# Patient Record
Sex: Male | Born: 1985 | Race: Black or African American | Hispanic: No | Marital: Single | Smoking: Never smoker
Health system: Southern US, Community
[De-identification: ages and names within clinical notes are randomized; demographics above are authoritative.]

## PROBLEM LIST (undated history)

## (undated) DIAGNOSIS — U071 COVID-19: Secondary | ICD-10-CM

## (undated) HISTORY — PX: HERNIA REPAIR: SHX51

---

## 2003-12-28 ENCOUNTER — Other Ambulatory Visit: Payer: Self-pay

## 2005-03-24 ENCOUNTER — Emergency Department: Payer: Self-pay | Admitting: Internal Medicine

## 2005-04-03 ENCOUNTER — Emergency Department: Payer: Self-pay | Admitting: Emergency Medicine

## 2007-02-11 ENCOUNTER — Emergency Department: Payer: Self-pay | Admitting: Emergency Medicine

## 2016-02-18 ENCOUNTER — Emergency Department
Admission: EM | Admit: 2016-02-18 | Discharge: 2016-02-18 | Disposition: A | Discharge status: Home / Self Care | Payer: Medicaid Other | Attending: Emergency Medicine | Admitting: Emergency Medicine

## 2016-02-18 ENCOUNTER — Encounter: Payer: Self-pay | Admitting: Emergency Medicine

## 2016-02-18 ENCOUNTER — Emergency Department: Payer: Medicaid Other

## 2016-02-18 DIAGNOSIS — S161XXA Strain of muscle, fascia and tendon at neck level, initial encounter: Secondary | ICD-10-CM | POA: Insufficient documentation

## 2016-02-18 DIAGNOSIS — Y9289 Other specified places as the place of occurrence of the external cause: Secondary | ICD-10-CM | POA: Insufficient documentation

## 2016-02-18 DIAGNOSIS — Y998 Other external cause status: Secondary | ICD-10-CM | POA: Diagnosis not present

## 2016-02-18 DIAGNOSIS — X58XXXA Exposure to other specified factors, initial encounter: Secondary | ICD-10-CM | POA: Diagnosis not present

## 2016-02-18 DIAGNOSIS — M546 Pain in thoracic spine: Secondary | ICD-10-CM

## 2016-02-18 DIAGNOSIS — S199XXA Unspecified injury of neck, initial encounter: Secondary | ICD-10-CM | POA: Diagnosis present

## 2016-02-18 DIAGNOSIS — S299XXA Unspecified injury of thorax, initial encounter: Secondary | ICD-10-CM | POA: Insufficient documentation

## 2016-02-18 DIAGNOSIS — Z880 Allergy status to penicillin: Secondary | ICD-10-CM | POA: Diagnosis not present

## 2016-02-18 DIAGNOSIS — Y9389 Activity, other specified: Secondary | ICD-10-CM | POA: Insufficient documentation

## 2016-02-18 MED ORDER — MELOXICAM 15 MG PO TABS: 15.0000 mg | ORAL_TABLET | Freq: Every day | ORAL | Status: DC

## 2016-02-18 MED ORDER — CYCLOBENZAPRINE HCL 10 MG PO TABS: 10.0000 mg | ORAL_TABLET | Freq: Three times a day (TID) | ORAL | Status: DC | PRN

## 2016-02-18 NOTE — Discharge Instructions (Signed)
Back Pain, Adult °Back pain is very common in adults. The cause of back pain is rarely dangerous and the pain often gets better over time. The cause of your back pain may not be known. Some common causes of back pain include: °· Strain of the muscles or ligaments supporting the spine. °· Wear and tear (degeneration) of the spinal disks. °· Arthritis. °· Direct injury to the back. °For many people, back pain may return. Since back pain is rarely dangerous, most people can learn to manage this condition on their own. °HOME CARE INSTRUCTIONS °Watch your back pain for any changes. The following actions may help to lessen any discomfort you are feeling: °· Remain active. It is stressful on your back to sit or stand in one place for long periods of time. Do not sit, drive, or stand in one place for more than 30 minutes at a time. Take short walks on even surfaces as soon as you are able. Try to increase the length of time you walk each day. °· Exercise regularly as directed by your health care provider. Exercise helps your back heal faster. It also helps avoid future injury by keeping your muscles strong and flexible. °· Do not stay in bed. Resting more than 1-2 days can delay your recovery. °· Pay attention to your body when you bend and lift. The most comfortable positions are those that put less stress on your recovering back. Always use proper lifting techniques, including: °· Bending your knees. °· Keeping the load close to your body. °· Avoiding twisting. °· Find a comfortable position to sleep. Use a firm mattress and lie on your side with your knees slightly bent. If you lie on your back, put a pillow under your knees. °· Avoid feeling anxious or stressed. Stress increases muscle tension and can worsen back pain. It is important to recognize when you are anxious or stressed and learn ways to manage it, such as with exercise. °· Take medicines only as directed by your health care provider. Over-the-counter  medicines to reduce pain and inflammation are often the most helpful. Your health care provider may prescribe muscle relaxant drugs. These medicines help dull your pain so you can more quickly return to your normal activities and healthy exercise. °· Apply ice to the injured area: °· Put ice in a plastic bag. °· Place a towel between your skin and the bag. °· Leave the ice on for 20 minutes, 2-3 times a day for the first 2-3 days. After that, ice and heat may be alternated to reduce pain and spasms. °· Maintain a healthy weight. Excess weight puts extra stress on your back and makes it difficult to maintain good posture. °SEEK MEDICAL CARE IF: °· You have pain that is not relieved with rest or medicine. °· You have increasing pain going down into the legs or buttocks. °· You have pain that does not improve in one week. °· You have night pain. °· You lose weight. °· You have a fever or chills. °SEEK IMMEDIATE MEDICAL CARE IF:  °· You develop new bowel or bladder control problems. °· You have unusual weakness or numbness in your arms or legs. °· You develop nausea or vomiting. °· You develop abdominal pain. °· You feel faint. °  °This information is not intended to replace advice given to you by your health care provider. Make sure you discuss any questions you have with your health care provider. °  °Document Released: 12/02/2005 Document Revised: 12/23/2014 Document Reviewed: 04/05/2014 °Elsevier Interactive Patient Education ©2016 Elsevier   Inc[employer]  Chronic Back Pain  When back pain lasts longer than 3 months, it is called chronic back pain.People with chronic back pain often go through certain periods that are more intense (flare-ups).  CAUSES Chronic back pain can be caused by wear and tear (degeneration) on different structures in your back. These structures include:  The bones of your spine (vertebrae) and the joints surrounding your spinal cord and nerve roots (facets).  The strong, fibrous tissues that  connect your vertebrae (ligaments). Degeneration of these structures may result in pressure on your nerves. This can lead to constant pain. HOME CARE INSTRUCTIONS  Avoid bending, heavy lifting, prolonged sitting, and activities which make the problem worse.  Take brief periods of rest throughout the day to reduce your pain. Lying down or standing usually is better than sitting while you are resting.  Take over-the-counter or prescription medicines only as directed by your caregiver. SEEK IMMEDIATE MEDICAL CARE IF:   You have weakness or numbness in one of your legs or feet.  You have trouble controlling your bladder or bowels.  You have nausea, vomiting, abdominal pain, shortness of breath, or fainting.   This information is not intended to replace advice given to you by your health care provider. Make sure you discuss any questions you have with your health care provider.   Document Released: 01/09/2005 Document Revised: 02/24/2012 Document Reviewed: 05/22/2015 Elsevier Interactive Patient Education 2016 Elsevier Inc.  Musculoskeletal Pain Musculoskeletal pain is muscle and boney aches and pains. These pains can occur in any part of the body. Your caregiver may treat you without knowing the cause of the pain. They may treat you if blood or urine tests, X-rays, and other tests were normal.  CAUSES There is often not a definite cause or reason for these pains. These pains may be caused by a type of germ (virus). The discomfort may also come from overuse. Overuse includes working out too hard when your body is not fit. Boney aches also come from weather changes. Bone is sensitive to atmospheric pressure changes. HOME CARE INSTRUCTIONS   Ask when your test results will be ready. Make sure you get your test results.  Only take over-the-counter or prescription medicines for pain, discomfort, or fever as directed by your caregiver. If you were given medications for your condition, do not  drive, operate machinery or power tools, or sign legal documents for 24 hours. Do not drink alcohol. Do not take sleeping pills or other medications that may interfere with treatment.  Continue all activities unless the activities cause more pain. When the pain lessens, slowly resume normal activities. Gradually increase the intensity and duration of the activities or exercise.  During periods of severe pain, bed rest may be helpful. Lay or sit in any position that is comfortable.  Putting ice on the injured area.  Put ice in a bag.  Place a towel between your skin and the bag.  Leave the ice on for 15 to 20 minutes, 3 to 4 times a day.  Follow up with your caregiver for continued problems and no reason can be found for the pain. If the pain becomes worse or does not go away, it may be necessary to repeat tests or do additional testing. Your caregiver may need to look further for a possible cause. SEEK IMMEDIATE MEDICAL CARE IF:  You have pain that is getting worse and is not relieved by medications.  You develop chest pain that is associated with shortness or breath,  sweating, feeling sick to your stomach (nauseous), or throw up (vomit).  Your pain becomes localized to the abdomen.  You develop any new symptoms that seem different or that concern you. MAKE SURE YOU:   Understand these instructions.  Will watch your condition.  Will get help right away if you are not doing well or get worse.   This information is not intended to replace advice given to you by your health care provider. Make sure you discuss any questions you have with your health care provider.   Document Released: 12/02/2005 Document Revised: 02/24/2012 Document Reviewed: 08/06/2013 Elsevier Interactive Patient Education [employer]

## 2016-02-18 NOTE — ED Notes (Signed)
Pt c/o back pain x 2 months. Pt denies injury at this time. Pt ambulatory with NAD noted at this time.

## 2016-02-18 NOTE — ED Notes (Signed)
Pt taken to Xray at this time.

## 2016-02-18 NOTE — ED Provider Notes (Signed)
[facility] Emergency Department Provider Note  ____________________________________________  Time seen: Approximately 4:51 PM  I have reviewed the triage vital signs and the nursing notes.   HISTORY  Chief Complaint Back Pain    HPI Ray Mcdonald is a 30 y.o. male who presents for evaluation of neck and upper back pain 2 months. Patient states that he tried ibuprofen and Tylenol over-the-counter with no relief. Rates his pain as a 10 over 10 at times. He is not sure if it's the way he sleeps or if it's his mattress which is about 32 years old. Denies any numbness tingling no radiation of pain. Pain is worsened with excessive lifting or moving, relieved with resting or sitting up in a chair.   History reviewed. No pertinent past medical history.  There are no active problems to display for this patient.   Past Surgical History  Procedure Laterality Date  . Hernia repair      Current Outpatient Rx  Name  Route  Sig  Dispense  Refill  . cyclobenzaprine (FLEXERIL) 10 MG tablet   Oral   Take 1 tablet (10 mg total) by mouth every 8 (eight) hours as needed for muscle spasms.   30 tablet   1   . meloxicam (MOBIC) 15 MG tablet   Oral   Take 1 tablet (15 mg total) by mouth daily.   30 tablet   0     Allergies Penicillins  History reviewed. No pertinent family history.  Social History Social History  Substance Use Topics  . Smoking status: Never Smoker   . Smokeless tobacco: Never Used  . Alcohol Use: Yes     Comment: occassionally    Review of Systems Constitutional: No fever/chills Eyes: No visual changes. ENT: No sore throat. Cardiovascular: Denies chest pain. Respiratory: Denies shortness of breath. Musculoskeletal: Positive for upper neck and back pain.. Skin: Negative for rash. Neurological: Negative for headaches, focal weakness or numbness.  10-point ROS otherwise  negative.  ____________________________________________   PHYSICAL EXAM: BP 144/85 mmHg  Pulse 72  Temp(Src) 97.4 F (36.3 C) (Oral)  Resp 18  Ht  (1.905 m)  Wt 113.399 kg  BMI 31.25 kg/m2  SpO2 100%  VITAL SIGNS: ED Triage Vitals  Enc Vitals Group     BP --      Pulse --      Resp --      Temp --      Temp src --      SpO2 --      Weight --      Height --      Head Cir --      Peak Flow --      Pain Score --      Pain Loc --      Pain Edu? --      Excl. in GC? --     Constitutional: Alert and oriented. Well appearing and in no acute distress. Neck: No stridor. Neck full range of motion nontender.   Cardiovascular: Normal rate, regular rhythm. Grossly normal heart sounds.  Good peripheral circulation. Respiratory: Normal respiratory effort.  No retractions. Lungs CTAB. Musculoskeletal: Thoracic spine point tenderness noted both vertebral in paraspinally. Upper extremities with full range of motion distally neurovascularly intact strength intact equal bilaterally. Able to bend and flex and touch his toes. Neurologic:  Normal speech and language. No gross focal neurologic deficits are appreciated. No gait instability. Skin:  Skin is warm, dry and  intact. No rash noted. Psychiatric: Mood and affect are normal. Speech and behavior are normal.  ____________________________________________   LABS (all labs ordered are listed, but only abnormal results are displayed)  Labs Reviewed - No data to display  RADIOLOGY  Cervical and thoracic x-rays negative for any acute osseous findings. ____________________________________________   PROCEDURES  Procedure(s) performed: None  Critical Care performed: No  ____________________________________________   INITIAL IMPRESSION / ASSESSMENT AND PLAN / ED COURSE  Pertinent labs & imaging results that were available during my care of the patient were reviewed by me and considered in my medical decision making (see  chart for details).  Unexplained cervical and thoracic paraspinal pain. Rx given for Mobic 15 mg daily and Flexeril 10 mg 3 times a day. Work excuse 24 hours given. Patient follow-up PCP or return to the ER with any worsening symptomology. ____________________________________________   FINAL CLINICAL IMPRESSION(S) / ED DIAGNOSES  Final diagnoses:  Cervical myofascial strain, initial encounter  Thoracic back pain, unspecified back pain laterality     This chart was dictated using voice recognition software/Dragon. Despite best efforts to proofread, errors can occur which can change the meaning. Any change was purely unintentional.   Evangeline Dakinharles M [employer], PA-C 02/18/16 1814  Phineas SemenGraydon Goodman, MD 02/20/16 [phone]

## 2016-02-18 NOTE — ED Notes (Signed)
NAD noted at time of D/C. Pt denies questions or concerns. Pt ambulatory to the lobby at this time.  

## 2017-02-17 ENCOUNTER — Encounter: Payer: Self-pay | Admitting: Emergency Medicine

## 2017-02-17 ENCOUNTER — Emergency Department: Payer: Self-pay

## 2017-02-17 DIAGNOSIS — J069 Acute upper respiratory infection, unspecified: Secondary | ICD-10-CM | POA: Insufficient documentation

## 2017-02-17 NOTE — ED Triage Notes (Signed)
Pt presents to ED with cough and nasal congestion for the past 3-4 days. Denies fever.

## 2017-02-18 ENCOUNTER — Emergency Department
Admission: EM | Admit: 2017-02-18 | Discharge: 2017-02-18 | Disposition: A | Discharge status: Home / Self Care | Payer: Self-pay | Attending: Emergency Medicine | Admitting: Emergency Medicine

## 2017-02-18 DIAGNOSIS — J069 Acute upper respiratory infection, unspecified: Secondary | ICD-10-CM

## 2017-02-18 MED ORDER — BENZONATATE 100 MG PO CAPS
100.0000 mg | ORAL_CAPSULE | Freq: Four times a day (QID) | ORAL | 0 refills | Status: DC | PRN
Start: 2017-02-18 — End: 2019-05-25

## 2017-02-18 MED ORDER — BENZONATATE 100 MG PO CAPS
100.0000 mg | ORAL_CAPSULE | Freq: Once | ORAL | Status: AC
  Administered 2017-02-18: 100 mg via ORAL
  Filled 2017-02-18: qty 1

## 2017-02-18 NOTE — ED Notes (Signed)
Patient discharged to home per MD order. Patient in stable condition, and deemed medically cleared by ED provider for discharge. Discharge instructions reviewed with patient/family using "Teach Back"; verbalized understanding of medication education and administration, and information about follow-up care. Denies further concerns. ° °

## 2017-02-18 NOTE — ED Provider Notes (Signed)
[facility] Emergency Department Provider Note   ____________________________________________   First MD Initiated Contact with Patient 02/18/17 0209     (approximate)  I have reviewed the triage vital signs and the nursing notes.   HISTORY  Chief Complaint Cough and Nasal Congestion    HPI Ray Mcdonald is a 31 y.o. male who comes into the hospital today with some congestion and a nonproductive cough. The patient has a little bit of chest pain when he coughs but nothing consistent. He has tried some over-the-counter medications but it has not helped with the symptoms. The patient denies any fever, sick contacts, shortness of breath, nausea or vomiting. The patient has been having these symptoms for the past 3-4 days. He was concerned that it was not getting any better so he decided to come into the hospital today for evaluation.   History reviewed. No pertinent past medical history.  There are no active problems to display for this patient.   Past Surgical History:  Procedure Laterality Date  . HERNIA REPAIR      Prior to Admission medications   Medication Sig Start Date End Date Taking? Authorizing Provider  benzonatate (TESSALON PERLES) 100 MG capsule Take 1 capsule (100 mg total) by mouth every 6 (six) hours as needed for cough. 02/18/17   Rebecka ApleyAllison P Shavy Beachem, MD  cyclobenzaprine (FLEXERIL) 10 MG tablet Take 1 tablet (10 mg total) by mouth every 8 (eight) hours as needed for muscle spasms. 02/18/16   Charmayne Sheerharles M Beers, PA-C  meloxicam (MOBIC) 15 MG tablet Take 1 tablet (15 mg total) by mouth daily. 02/18/16   Evangeline Dakinharles M Beers, PA-C    Allergies Penicillins  No family history on file.  Social History Social History  Substance Use Topics  . Smoking status: Never Smoker  . Smokeless tobacco: Never Used  . Alcohol use Yes     Comment: occassionally    Review of Systems Constitutional: No fever/chills Eyes: No visual changes. ENT: No sore  throat. Cardiovascular:  chest pain. Respiratory: cough Gastrointestinal: No abdominal pain.  No nausea, no vomiting.  No diarrhea.  No constipation. Genitourinary: Negative for dysuria. Musculoskeletal: Negative for back pain. Skin: Negative for rash. Neurological: Negative for headaches, focal weakness or numbness.  10-point ROS otherwise negative.  ____________________________________________   PHYSICAL EXAM:  VITAL SIGNS: ED Triage Vitals  Enc Vitals Group     BP 02/17/17 2237 (!) 166/98     Pulse Rate 02/17/17 2237 97     Resp 02/17/17 2237 20     Temp 02/17/17 2237 98.1 F (36.7 C)     Temp Source 02/17/17 2237 Oral     SpO2 02/17/17 2237 96 %     Weight 02/17/17 2237 280 lb (127 kg)     Height 02/17/17 2237 6\' 3"  (1.905 m)     Head Circumference --      Peak Flow --      Pain Score 02/17/17 2238 0     Pain Loc --      Pain Edu? --      Excl. in GC? --     Constitutional: Alert and oriented. Well appearing and in mild distress. Eyes: Conjunctivae are normal. PERRL. EOMI. Head: Atraumatic. Nose: No congestion/rhinnorhea. Mouth/Throat: Mucous membranes are moist.  Oropharynx non-erythematous. Cardiovascular: Normal rate, regular rhythm. Grossly normal heart sounds.  Good peripheral circulation. Respiratory: Normal respiratory effort.  No retractions. Lungs CTAB. Gastrointestinal: Soft and nontender. No distention. Positive bowel sounds Musculoskeletal: No  lower extremity tenderness nor edema.   Neurologic:  Normal speech and language.  Skin:  Skin is warm, dry and intact.  Psychiatric: Mood and affect are normal.   ____________________________________________   LABS (all labs ordered are listed, but only abnormal results are displayed)  Labs Reviewed - No data to  display ____________________________________________  EKG  none ____________________________________________  RADIOLOGY  CXR ____________________________________________   PROCEDURES  Procedure(s) performed: None  Procedures  Critical Care performed: No  ____________________________________________   INITIAL IMPRESSION / ASSESSMENT AND PLAN / ED COURSE  Pertinent labs & imaging results that were available during my care of the patient were reviewed by me and considered in my medical decision making (see chart for details).  This is a 31 year old male who comes into the hospital today with some congestion and cough. The patient is not having any wheezing and no shortness of breath he is not retracting or in any acute distress. The patient had a chest x-ray which did not show pneumonia. I feel that the patient has an upper respiratory infection. I will give the patient a dose of benzonatate. I informed him that viral upper respiratory infections can have symptoms from 7-10 days and the cough can linger for a few weeks. The patient be discharged home to follow-up with the acute care clinic.  Clinical Course as of Feb 18 345  Tue Feb 18, 2017  0209 No active cardiopulmonary disease. DG Chest 2 View [AW]    Clinical Course User Index [AW] Rebecka Apley, MD     ____________________________________________   FINAL CLINICAL IMPRESSION(S) / ED DIAGNOSES  Final diagnoses:  Viral upper respiratory tract infection      NEW MEDICATIONS STARTED DURING THIS VISIT:  Discharge Medication List as of 02/18/2017  2:54 AM    START taking these medications   Details  benzonatate (TESSALON PERLES) 100 MG capsule Take 1 capsule (100 mg total) by mouth every 6 (six) hours as needed for cough., Starting Tue 02/18/2017, Print         Note:  This document was prepared using Dragon voice recognition software and may include unintentional dictation errors.    Rebecka Apley,  MD 02/18/17 [phone]

## 2017-02-18 NOTE — ED Notes (Addendum)
Pt in with co cough for few days denies any fever for few days, also co nasal congestion and cold symptoms.

## 2017-03-17 ENCOUNTER — Encounter: Payer: Self-pay | Admitting: *Deleted

## 2017-03-17 ENCOUNTER — Emergency Department
Admission: EM | Admit: 2017-03-17 | Discharge: 2017-03-17 | Disposition: A | Discharge status: Home / Self Care | Payer: Medicaid Other | Attending: Emergency Medicine | Admitting: Emergency Medicine

## 2017-03-17 DIAGNOSIS — M65351 Trigger finger, right little finger: Secondary | ICD-10-CM | POA: Insufficient documentation

## 2017-03-17 MED ORDER — MELOXICAM 7.5 MG PO TABS: 7.5000 mg | ORAL_TABLET | Freq: Every day | ORAL | 1 refills | Status: AC

## 2017-03-17 NOTE — ED Provider Notes (Signed)
[facility] Emergency Department Provider Note  ____________________________________________  Time seen: Approximately 7:51 PM  I have reviewed the triage vital signs and the nursing notes.   HISTORY  Chief Complaint Hand Pain    HPI Ray Mcdonald is a 31 y.o. male presenting to the emergency department with a trigger finger of the right fifth digit. Patient states that he was taking a shower this morning when he noticed that his right fifth digit was locked into flexion. Patient has not experienced similar symptoms in the past. Patient is concerned as he works in [employer]. No alleviating measures have been attempted. Patient denies weakness and radiculopathy.   History reviewed. No pertinent past medical history.  There are no active problems to display for this patient.   Past Surgical History:  Procedure Laterality Date  . HERNIA REPAIR      Prior to Admission medications   Medication Sig Start Date End Date Taking? Authorizing Provider  benzonatate (TESSALON PERLES) 100 MG capsule Take 1 capsule (100 mg total) by mouth every 6 (six) hours as needed for cough. 02/18/17   Rebecka Apley, MD  cyclobenzaprine (FLEXERIL) 10 MG tablet Take 1 tablet (10 mg total) by mouth every 8 (eight) hours as needed for muscle spasms. 02/18/16   Charmayne Sheer Beers, PA-C  meloxicam (MOBIC) 7.5 MG tablet Take 1 tablet (7.5 mg total) by mouth daily. 03/17/17 03/24/17  Orvil Feil, PA-C    Allergies Penicillins  History reviewed. No pertinent family history.  Social History Social History  Substance Use Topics  . Smoking status: Never Smoker  . Smokeless tobacco: Never Used  . Alcohol use Yes     Comment: occassionally     Review of Systems  Constitutional: No fever/chills Eyes: No visual changes. No discharge ENT: No upper respiratory complaints. Cardiovascular: no chest pain. Musculoskeletal: Patient has right fifth digit trigger finger. Skin:  Negative for rash, abrasions, lacerations, ecchymosis. Neurological: Negative for headaches, focal weakness or numbness. ____________________________________________   PHYSICAL EXAM:  VITAL SIGNS: ED Triage Vitals [03/17/17 1616]  Enc Vitals Group     BP 131/81     Pulse Rate 87     Resp 18     Temp 98.5 F (36.9 C)     Temp Source Oral     SpO2 97 %     Weight 280 lb (127 kg)     Height  (1.905 m)     Head Circumference      Peak Flow      Pain Score      Pain Loc      Pain Edu?      Excl. in GC?     Constitutional: Alert and oriented. Well appearing and in no acute distress. Eyes: Conjunctivae are normal. PERRL. EOMI. Head: Atraumatic. Cardiovascular: Normal rate, regular rhythm. Normal S1 and S2.  Good peripheral circulation. Respiratory: Normal respiratory effort without tachypnea or retractions. Lungs CTAB. Good air entry to the bases with no decreased or absent breath sounds. Musculoskeletal: Patient has 5 out of 5 strength in the upper extremities bilaterally. Patient's right fifth digit is locked into 30 of flexion at the DIP joint. There is a palpable nodule at the A1 pulley. Palpable radial and ulnar pulses bilaterally and symmetrically. Neurologic:  Normal speech and language. No gross focal neurologic deficits are appreciated. Reflexes are 2+ and symmetric in the upper extremities bilaterally. Skin:  Skin is warm, dry and intact. No rash noted. Psychiatric: Mood  and affect are normal. Speech and behavior are normal. Patient exhibits appropriate insight and judgement. ____________________________________________   LABS (all labs ordered are listed, but only abnormal results are displayed)  Labs Reviewed - No data to display ____________________________________________  EKG   ____________________________________________  RADIOLOGY  No results found.  ____________________________________________    PROCEDURES  Procedure(s) performed:     Procedures    Medications - No data to display   ____________________________________________   INITIAL IMPRESSION / ASSESSMENT AND PLAN / ED COURSE  Pertinent labs & imaging results that were available during my care of the patient were reviewed by me and considered in my medical decision making (see chart for details).  Review of the [place] CSRS was performed in accordance of the [facility] prior to dispensing any controlled drugs.     Assessment and plan: Right Fifth Digit Trigger Finger  Patient presents to the emergency department with stenosing tenosynovitis of the right fifth digit. Patient's right fifth digit was splinted into extension in the emergency department. Patient was discharged with Mobic. A work note was given. A referral was given orthopedics, Dr. Joice Lofts. All patient questions were answered.  ____________________________________________  FINAL CLINICAL IMPRESSION(S) / ED DIAGNOSES  Final diagnoses:  Trigger little finger of right hand      NEW MEDICATIONS STARTED DURING THIS VISIT:  New Prescriptions   MELOXICAM (MOBIC) 7.5 MG TABLET    Take 1 tablet (7.5 mg total) by mouth daily.        This chart was dictated using voice recognition software/Dragon. Despite best efforts to proofread, errors can occur which can change the meaning. Any change was purely unintentional.    Orvil Feil, PA-C 03/17/17 2007    Sharyn Creamer, MD 03/17/17 [phone]

## 2017-03-17 NOTE — ED Notes (Signed)
Splint applied to pt by ED staff, pt denies numbness, tingling to fingers.

## 2017-03-17 NOTE — ED Notes (Signed)
See triage note  Developed right 5 th finger stiffness and numbness today  No injury

## 2017-03-17 NOTE — ED Triage Notes (Signed)
Pt reports sudden onset of right pinky numbness and stiffness this afternoon after taking a shower. PT denies injury. PT reports he is unable to move pinky. Pt denies pain to the finger. Small amount of swelling noted.

## 2017-03-20 ENCOUNTER — Emergency Department: Payer: Medicaid Other

## 2017-03-20 ENCOUNTER — Encounter: Payer: Self-pay | Admitting: Emergency Medicine

## 2017-03-20 ENCOUNTER — Emergency Department
Admission: EM | Admit: 2017-03-20 | Discharge: 2017-03-20 | Disposition: A | Discharge status: Home / Self Care | Payer: Medicaid Other | Attending: Emergency Medicine | Admitting: Emergency Medicine

## 2017-03-20 DIAGNOSIS — M65351 Trigger finger, right little finger: Secondary | ICD-10-CM | POA: Insufficient documentation

## 2017-03-20 NOTE — ED Notes (Signed)
See triage note  Having pain to right 5 th finger. finger is inflamed and having increased pain

## 2017-03-20 NOTE — ED Triage Notes (Signed)
Pt comes into the ED via POV c/o right 5th digit finger pain with inflammation and redness present.  Patient states he cannot bend his finger.  It was diagnosed as trigger finger but the pain has increased and there has been no improvement in the finger.  Patient would like an x-ray of the finger to make sure it is not broken.

## 2017-03-20 NOTE — ED Provider Notes (Signed)
[facility] Emergency Department Provider Note  ____________________________________________  Time seen: Approximately 3:06 PM  I have reviewed the triage vital signs and the nursing notes.   HISTORY  Chief Complaint Hand Pain    HPI Ray Mcdonald is a 31 y.o. male who presents emergency department for complaint of continued sugar finger. Patient was evaluated in this department 3 days prior and was diagnosed sugar finger. Patient reports to the emergency department because "it's not better." Patient is also concerned that "it might be broken." Patient denies any direct trauma to the hand or finger. Patient reports pain to the distal phalanx. No other pain. Patient has been taking the meloxicam as prescribed. He has been wearing the splint but is not wearing same today. No other injury or complaint at this time.   History reviewed. No pertinent past medical history.  There are no active problems to display for this patient.   Past Surgical History:  Procedure Laterality Date  . HERNIA REPAIR      Prior to Admission medications   Medication Sig Start Date End Date Taking? Authorizing Provider  benzonatate (TESSALON PERLES) 100 MG capsule Take 1 capsule (100 mg total) by mouth every 6 (six) hours as needed for cough. 02/18/17   Rebecka Apley, MD  cyclobenzaprine (FLEXERIL) 10 MG tablet Take 1 tablet (10 mg total) by mouth every 8 (eight) hours as needed for muscle spasms. 02/18/16   Charmayne Sheer Beers, PA-C  meloxicam (MOBIC) 7.5 MG tablet Take 1 tablet (7.5 mg total) by mouth daily. 03/17/17 03/24/17  Orvil Feil, PA-C    Allergies Hydrocodone and Penicillins  No family history on file.  Social History Social History  Substance Use Topics  . Smoking status: Never Smoker  . Smokeless tobacco: Never Used  . Alcohol use Yes     Comment: occassionally     Review of Systems  Constitutional: No fever/chills Cardiovascular: no chest  pain. Respiratory: no cough. No SOB. Musculoskeletal: Positive for trigger finger to the fifth digit of the right hand. Skin: Negative for rash, abrasions, lacerations, ecchymosis. Neurological: Negative for headaches, focal weakness or numbness. 10-point ROS otherwise negative.  ____________________________________________   PHYSICAL EXAM:  VITAL SIGNS: ED Triage Vitals  Enc Vitals Group     BP 03/20/17 1421 (!) 146/84     Pulse Rate 03/20/17 1421 88     Resp 03/20/17 1421 18     Temp 03/20/17 1421 99 F (37.2 C)     Temp Source 03/20/17 1421 Oral     SpO2 03/20/17 1421 98 %     Weight 03/20/17 1422 280 lb (127 kg)     Height 03/20/17 1422  (1.905 m)     Head Circumference --      Peak Flow --      Pain Score 03/20/17 1421 4     Pain Loc --      Pain Edu? --      Excl. in GC? --      Constitutional: Alert and oriented. Well appearing and in no acute distress. Eyes: Conjunctivae are normal. PERRL. EOMI. Head: Atraumatic. Neck: No stridor.    Cardiovascular: Normal rate, regular rhythm. Normal S1 and S2.  Good peripheral circulation. Respiratory: Normal respiratory effort without tachypnea or retractions. Lungs CTAB. Good air entry to the bases with no decreased or absent breath sounds. Musculoskeletal: Full range of motion to all extremities. No gross deformities appreciated.Constantly flex the digit is identified. No deformity. No  erythema. No edema to the digit. Patient has difficulty extending the finger fully. No difficulty with flexion. DIP joint is flexed at approximately 20 of flexion. Cap refill and sensation intact distally. Neurologic:  Normal speech and language. No gross focal neurologic deficits are appreciated.  Skin:  Skin is warm, dry and intact. No rash noted. Psychiatric: Mood and affect are normal. Speech and behavior are normal. Patient exhibits appropriate insight and judgement.   ____________________________________________   LABS (all labs  ordered are listed, but only abnormal results are displayed)  Labs Reviewed - No data to display ____________________________________________  EKG   ____________________________________________  RADIOLOGY Festus Barren Cuthriell, personally viewed and evaluated these images (plain radiographs) as part of my medical decision making, as well as reviewing the written report by the radiologist.  Dg Finger Little Right  Result Date: 03/20/2017 CLINICAL DATA:  Generalized pain and numbness in the right fifth finger without known injury. Onset of symptoms today after getting out of the shower. EXAM: RIGHT LITTLE FINGER 2+V COMPARISON:  None in PACs FINDINGS: The bones of the right fifth finger are subjectively adequately mineralized. The joint spaces are reasonably well-maintained. There is no acute fracture or dislocation. There is no lytic nor blastic lesion. The soft tissues are unremarkable. IMPRESSION: There is no acute or significant chronic bony abnormality of the right fifth finger. Electronically Signed   By: David  [place] M.D.   On: 03/20/2017 14:51    ____________________________________________    PROCEDURES  Procedure(s) performed:    Procedures    Medications - No data to display   ____________________________________________   INITIAL IMPRESSION / ASSESSMENT AND PLAN / ED COURSE  Pertinent labs & imaging results that were available during my care of the patient were reviewed by me and considered in my medical decision making (see chart for details).  Review of the [place] CSRS was performed in accordance of the [facility] prior to dispensing any controlled drugs.     Patient's diagnosis is consistent with trigger finger to the fifth digit of the right hand. Patient was previously diagnosed with same. Patient reports the emergency department after no improvement after 3 days. Patient has been instructed to continue to use meloxicam, continue using her splint. If no  improvement after 2 weeks patient will follow-up with orthopedics. No new medications prescribed.. Patient is given ED precautions to return to the ED for any worsening or new symptoms.     ____________________________________________  FINAL CLINICAL IMPRESSION(S) / ED DIAGNOSES  Final diagnoses:  Trigger little finger of right hand      NEW MEDICATIONS STARTED DURING THIS VISIT:  New Prescriptions   No medications on file        This chart was dictated using voice recognition software/Dragon. Despite best efforts to proofread, errors can occur which can change the meaning. Any change was purely unintentional.     Racheal Patches, PA-C 03/20/17 1525    Rockne Menghini, MD 03/21/17 [id number]

## 2019-05-25 ENCOUNTER — Encounter: Payer: Self-pay | Admitting: Medical Oncology

## 2019-05-25 ENCOUNTER — Emergency Department
Admission: EM | Admit: 2019-05-25 | Discharge: 2019-05-25 | Disposition: A | Discharge status: Home / Self Care | Payer: Self-pay | Attending: Emergency Medicine | Admitting: Emergency Medicine

## 2019-05-25 ENCOUNTER — Other Ambulatory Visit: Payer: Self-pay

## 2019-05-25 DIAGNOSIS — Y999 Unspecified external cause status: Secondary | ICD-10-CM | POA: Insufficient documentation

## 2019-05-25 DIAGNOSIS — Y929 Unspecified place or not applicable: Secondary | ICD-10-CM | POA: Insufficient documentation

## 2019-05-25 DIAGNOSIS — M545 Low back pain, unspecified: Secondary | ICD-10-CM

## 2019-05-25 DIAGNOSIS — X500XXA Overexertion from strenuous movement or load, initial encounter: Secondary | ICD-10-CM | POA: Insufficient documentation

## 2019-05-25 DIAGNOSIS — Y9389 Activity, other specified: Secondary | ICD-10-CM | POA: Insufficient documentation

## 2019-05-25 LAB — URINALYSIS, COMPLETE (UACMP) WITH MICROSCOPIC
Bacteria, UA: NONE SEEN
Glucose, UA: NEGATIVE mg/dL
Hgb urine dipstick: NEGATIVE
Ketones, ur: 5 mg/dL — AB
Leukocytes,Ua: NEGATIVE
Nitrite: NEGATIVE
Protein, ur: 100 mg/dL — AB
Specific Gravity, Urine: 1.044 — ABNORMAL HIGH (ref 1.005–1.030)
pH: 5 (ref 5.0–8.0)

## 2019-05-25 MED ORDER — METHOCARBAMOL 500 MG PO TABS: 500.0000 mg | ORAL_TABLET | Freq: Three times a day (TID) | ORAL | 0 refills | Status: DC

## 2019-05-25 MED ORDER — NAPROXEN 500 MG PO TABS: 500.0000 mg | ORAL_TABLET | Freq: Two times a day (BID) | ORAL | 0 refills | Status: DC

## 2019-05-25 MED ORDER — KETOROLAC TROMETHAMINE 30 MG/ML IJ SOLN
30.0000 mg | Freq: Once | INTRAMUSCULAR | Status: AC
  Administered 2019-05-25: 30 mg via INTRAMUSCULAR
  Filled 2019-05-25: qty 1

## 2019-05-25 NOTE — Discharge Instructions (Signed)
Follow-up with [facility] acute care if any continued problems or your primary care provider.  Begin taking medication as directed.  You cannot take the muscle relaxant methocarbamol if you plan to drive or operate machinery.  He also cannot take this while you are working.  You may take the naproxen twice a day with food every day.  Also consider using ice or heat to your back as needed for discomfort.

## 2019-05-25 NOTE — ED Notes (Signed)
See triage note  presents with lower back pain  Stats developed pain after lifting something heavy yesterday  Ambulates well to treatment area

## 2019-05-25 NOTE — ED Provider Notes (Signed)
[facility] Emergency Department Provider Note  ____________________________________________   First MD Initiated Contact with Patient 05/25/19 1319     (approximate)  I have reviewed the triage vital signs and the nursing notes.   HISTORY  Chief Complaint Back Pain   HPI Rolm GalaGeoffery D Jonsson is a 33 y.o. male presents to the ED with complaint of left lower back pain that developed yesterday after lifting some heavy objects.  Patient denies any previous back problems.  There is no history of saddle anesthesias, radiculopathy or incontinence of bowel or bladder.  Patient has taken Tylenol infrequently without any relief.  Patient continues to ambulate without any assistance.  He rates his pain as 7 out of 10.      History reviewed. No pertinent past medical history.  There are no active problems to display for this patient.   Past Surgical History:  Procedure Laterality Date  . HERNIA REPAIR      Prior to Admission medications   Medication Sig Start Date End Date Taking? Authorizing Provider  methocarbamol (ROBAXIN) 500 MG tablet Take 1 tablet (500 mg total) by mouth 3 (three) times daily. 05/25/19   Tommi RumpsSummers, Kolbi Tofte L, PA-C  naproxen (NAPROSYN) 500 MG tablet Take 1 tablet (500 mg total) by mouth 2 (two) times daily with a meal. 05/25/19   Tommi RumpsSummers, Alliene Klugh L, PA-C    Allergies Hydrocodone and Penicillins  No family history on file.  Social History Social History   Tobacco Use  . Smoking status: Never Smoker  . Smokeless tobacco: Never Used  Substance Use Topics  . Alcohol use: Yes    Comment: occassionally  . Drug use: No    Review of Systems Constitutional: No fever/chills Cardiovascular: Denies chest pain. Respiratory: Denies shortness of breath. Gastrointestinal: No abdominal pain.  No nausea, no vomiting.  No diarrhea.  Genitourinary: Negative for dysuria.  Negative for hematuria. Musculoskeletal: Positive for left lower back pain.  Skin: Negative for rash. Neurological: Negative for headaches, focal weakness or numbness. ___________________________________________   PHYSICAL EXAM:  VITAL SIGNS: ED Triage Vitals  Enc Vitals Group     BP 05/25/19 1244 (!) 156/76     Pulse Rate 05/25/19 1244 68     Resp 05/25/19 1244 16     Temp 05/25/19 1244 98.6 F (37 C)     Temp Source 05/25/19 1244 Oral     SpO2 05/25/19 1244 99 %     Weight 05/25/19 1242 260 lb (117.9 kg)     Height 05/25/19 1242 6\' 3"  (1.905 m)     Head Circumference --      Peak Flow --      Pain Score 05/25/19 1242 7     Pain Loc --      Pain Edu? --      Excl. in GC? --    Constitutional: Alert and oriented. Well appearing and in no acute distress. Eyes: Conjunctivae are normal.  Head: Atraumatic. Neck: No stridor.   Cardiovascular: Normal rate, regular rhythm. Grossly normal heart sounds.  Good peripheral circulation. Respiratory: Normal respiratory effort.  No retractions. Lungs CTAB. Gastrointestinal: Soft and nontender. No distention.  No CVA tenderness. Musculoskeletal: On examination of the lower back there is no gross deformity.  There is tenderness on palpation of the left paravertebral muscles but no point tenderness on palpation of the lumbar spine or step-offs were appreciated.  Range of motion is without muscle spasms or limitations.  Good muscle strength bilaterally.  Straight leg  raises were negative. Neurologic:  Normal speech and language. No gross focal neurologic deficits are appreciated.  Reflexes 1+ bilaterally.  No gait instability. Skin:  Skin is warm, dry and intact.  Psychiatric: Mood and affect are normal. Speech and behavior are normal.  ____________________________________________   LABS (all labs ordered are listed, but only abnormal results are displayed)  Labs Reviewed  URINALYSIS, COMPLETE (UACMP) WITH MICROSCOPIC - Abnormal; Notable for the following components:      Result Value   Color, Urine YELLOW (*)     APPearance CLEAR (*)    Specific Gravity, Urine 1.044 (*)    Bilirubin Urine SMALL (*)    Ketones, ur 5 (*)    Protein, ur 100 (*)    All other components within normal limits    PROCEDURES  Procedure(s) performed (including Critical Care):  Procedures   ____________________________________________   INITIAL IMPRESSION / ASSESSMENT AND PLAN / ED COURSE  As part of my medical decision making, I reviewed the following data within the electronic MEDICAL RECORD NUMBER Notes from prior ED visits and [place] Controlled Substance Database  33 year old male presents to the ED with complaint of left lower back pain that started suddenly yesterday after lifting something heavy.  He denies any previous problems with back pain.  He denies any neurological symptoms that are suspicious for cauda equina.  Urinalysis was negative.  Patient was given a prescription for Toradol 30 mg IM.  He was discharged with a prescription for methocarbamol 500 mg 3 times daily when he is not driving or operating machinery.  He was also given a prescription for naproxen 500 mg twice daily.  He is encouraged to use ice or heat to his back as needed.  He will follow-up with his PCP or [facility]  [facility] acute care.  ____________________________________________   FINAL CLINICAL IMPRESSION(S) / ED DIAGNOSES  Final diagnoses:  Acute left-sided low back pain without sciatica     ED Discharge Orders         Ordered    naproxen (NAPROSYN) 500 MG tablet  2 times daily with meals     05/25/19 1432    methocarbamol (ROBAXIN) 500 MG tablet  3 times daily     05/25/19 1432           Note:  This document was prepared using Dragon voice recognition software and may include unintentional dictation errors.    Johnn Hai, PA-C 05/25/19 1437    Arta Silence, MD 05/25/19 [phone]

## 2019-05-25 NOTE — ED Triage Notes (Signed)
Lower back pain began yesterday, denies injury.

## 2020-03-11 ENCOUNTER — Emergency Department: Payer: HRSA Program

## 2020-03-11 ENCOUNTER — Emergency Department
Admission: EM | Admit: 2020-03-11 | Discharge: 2020-03-11 | Disposition: A | Discharge status: Home / Self Care | Payer: HRSA Program | Attending: Emergency Medicine | Admitting: Emergency Medicine

## 2020-03-11 ENCOUNTER — Other Ambulatory Visit: Payer: Self-pay

## 2020-03-11 DIAGNOSIS — U071 COVID-19: Secondary | ICD-10-CM | POA: Diagnosis not present

## 2020-03-11 DIAGNOSIS — Z79899 Other long term (current) drug therapy: Secondary | ICD-10-CM | POA: Diagnosis not present

## 2020-03-11 DIAGNOSIS — R0602 Shortness of breath: Secondary | ICD-10-CM | POA: Diagnosis present

## 2020-03-11 DIAGNOSIS — J189 Pneumonia, unspecified organism: Secondary | ICD-10-CM

## 2020-03-11 DIAGNOSIS — J1282 Pneumonia due to coronavirus disease 2019: Secondary | ICD-10-CM | POA: Insufficient documentation

## 2020-03-11 HISTORY — DX: COVID-19: U07.1

## 2020-03-11 LAB — BASIC METABOLIC PANEL
Anion gap: 11 (ref 5–15)
BUN: 14 mg/dL (ref 6–20)
CO2: 24 mmol/L (ref 22–32)
Calcium: 8.8 mg/dL — ABNORMAL LOW (ref 8.9–10.3)
Chloride: 99 mmol/L (ref 98–111)
Creatinine, Ser: 1.5 mg/dL — ABNORMAL HIGH (ref 0.61–1.24)
GFR calc Af Amer: >60 mL/min (ref >60)
GFR calc non Af Amer: 59 mL/min — ABNORMAL LOW (ref >60)
Glucose, Bld: 162 mg/dL — ABNORMAL HIGH (ref 70–99)
Potassium: 3.8 mmol/L (ref 3.5–5.1)
Sodium: 134 mmol/L — ABNORMAL LOW (ref 135–145)

## 2020-03-11 LAB — CBC
HCT: 46.9 % (ref 39.0–52.0)
Hemoglobin: 15.6 g/dL (ref 13.0–17.0)
MCH: 28.6 pg (ref 26.0–34.0)
MCHC: 33.3 g/dL (ref 30.0–36.0)
MCV: 85.9 fL (ref 80.0–100.0)
Platelets: 262 10*3/uL (ref 150–400)
RBC: 5.46 MIL/uL (ref 4.22–5.81)
RDW: 13.5 % (ref 11.5–15.5)
WBC: 4.3 10*3/uL (ref 4.0–10.5)
nRBC: 0 % (ref 0.0–0.2)

## 2020-03-11 LAB — TROPONIN I (HIGH SENSITIVITY): Troponin I (High Sensitivity): 5 ng/L (ref <18)

## 2020-03-11 MED ORDER — ACETAMINOPHEN 325 MG PO TABS
650.0000 mg | ORAL_TABLET | Freq: Once | ORAL | Status: AC | PRN
  Administered 2020-03-11: 650 mg via ORAL

## 2020-03-11 MED ORDER — DOXYCYCLINE HYCLATE 100 MG PO CAPS: 100.0000 mg | ORAL_CAPSULE | Freq: Two times a day (BID) | ORAL | 0 refills | Status: AC

## 2020-03-11 MED ORDER — DOXYCYCLINE HYCLATE 100 MG PO TABS
100.0000 mg | ORAL_TABLET | Freq: Once | ORAL | Status: AC
  Administered 2020-03-11: 13:00:00 100 mg via ORAL
  Filled 2020-03-11: qty 1

## 2020-03-11 MED ORDER — ACETAMINOPHEN 325 MG PO TABS
ORAL_TABLET | ORAL | Status: AC
  Filled 2020-03-11: qty 2

## 2020-03-11 MED ORDER — ALBUTEROL SULFATE HFA 108 (90 BASE) MCG/ACT IN AERS
2.0000 | INHALATION_SPRAY | Freq: Once | RESPIRATORY_TRACT | Status: AC
  Administered 2020-03-11: 2 via RESPIRATORY_TRACT
  Filled 2020-03-11: qty 6.7

## 2020-03-11 NOTE — ED Triage Notes (Signed)
Pt states he gets SOB when moving around. Was released from COVID quarantine this past Monday. Was previously COVID +. 99% RA in triage. RR even and unlabored. No distress noted. A&O, ambulatory.   Has been taking mucinex and tylenol. Last dose last night.

## 2020-03-11 NOTE — ED Notes (Signed)
First Nurse Note: Pt is COVID +, pt is still having shortness of breath and fevers. Pt is in NAD, texting on his cell phone.

## 2020-03-11 NOTE — ED Provider Notes (Signed)
[facility] Emergency Department Provider Note   ____________________________________________   First MD Initiated Contact with Patient 03/11/20 1130     (approximate)  I have reviewed the triage vital signs and the nursing notes.   HISTORY  Chief Complaint Shortness of Breath    HPI Ray Mcdonald is a 34 y.o. male with no significant past medical history who presents to the ED complaining of shortness of breath.  Patient reports that he tested positive for COVID-19 5 days ago and has had worsening shortness of breath since.  He states he will get out of breath when walking and also has continued to have fevers at home.  He denies any chest pain, cough, vomiting, diarrhea, or abdominal pain.  He has been taking Tylenol and Mucinex for his symptoms at home without significant relief.        Past Medical History:  Diagnosis Date  . COVID-19    03/06/20    There are no problems to display for this patient.   Past Surgical History:  Procedure Laterality Date  . HERNIA REPAIR      Prior to Admission medications   Medication Sig Start Date End Date Taking? Authorizing Provider  doxycycline (VIBRAMYCIN) 100 MG capsule Take 1 capsule (100 mg total) by mouth 2 (two) times daily for 7 days. 03/11/20 03/18/20  Chesley Noon, MD  methocarbamol (ROBAXIN) 500 MG tablet Take 1 tablet (500 mg total) by mouth 3 (three) times daily. 05/25/19   Tommi Rumps, PA-C  naproxen (NAPROSYN) 500 MG tablet Take 1 tablet (500 mg total) by mouth 2 (two) times daily with a meal. 05/25/19   Tommi Rumps, PA-C    Allergies Hydrocodone and Penicillins  History reviewed. No pertinent family history.  Social History Social History   Tobacco Use  . Smoking status: Never Smoker  . Smokeless tobacco: Never Used  Substance Use Topics  . Alcohol use: Yes    Comment: occassionally  . Drug use: No    Review of Systems  Constitutional: Positive for  fever/chills Eyes: No visual changes. ENT: No sore throat. Cardiovascular: Denies chest pain. Respiratory: Positive for shortness of breath. Gastrointestinal: No abdominal pain.  No nausea, no vomiting.  No diarrhea.  No constipation. Genitourinary: Negative for dysuria. Musculoskeletal: Negative for back pain. Skin: Negative for rash. Neurological: Negative for headaches, focal weakness or numbness.  ____________________________________________   PHYSICAL EXAM:  VITAL SIGNS: ED Triage Vitals  Enc Vitals Group     BP 03/11/20 1015 134/77     Pulse Rate 03/11/20 1015 91     Resp 03/11/20 1015 16     Temp 03/11/20 1015 (!) 100.4 F (38 C)     Temp Source 03/11/20 1015 Oral     SpO2 03/11/20 1015 99 %     Weight 03/11/20 1014 245 lb (111.1 kg)     Height 03/11/20 1014 6\' 3"  (1.905 m)     Head Circumference --      Peak Flow --      Pain Score 03/11/20 1014 0     Pain Loc --      Pain Edu? --      Excl. in GC? --     Constitutional: Alert and oriented. Eyes: Conjunctivae are normal. Head: Atraumatic. Nose: No congestion/rhinnorhea. Mouth/Throat: Mucous membranes are moist. Neck: Normal ROM Cardiovascular: Normal rate, regular rhythm. Grossly normal heart sounds. Respiratory: Normal respiratory effort.  No retractions. Lungs CTAB. Gastrointestinal: Soft and nontender. No distention.  Genitourinary: deferred Musculoskeletal: No lower extremity tenderness nor edema. Neurologic:  Normal speech and language. No gross focal neurologic deficits are appreciated. Skin:  Skin is warm, dry and intact. No rash noted. Psychiatric: Mood and affect are normal. Speech and behavior are normal.  ____________________________________________   LABS (all labs ordered are listed, but only abnormal results are displayed)  Labs Reviewed  BASIC METABOLIC PANEL - Abnormal; Notable for the following components:      Result Value   Sodium 134 (*)    Glucose, Bld 162 (*)    Creatinine, Ser  1.50 (*)    Calcium 8.8 (*)    GFR calc non Af Amer 59 (*)    All other components within normal limits  CBC  TROPONIN I (HIGH SENSITIVITY)   ____________________________________________  EKG  ED ECG REPORT I, Blake Divine, the attending physician, personally viewed and interpreted this ECG.   Date: 03/11/2020  EKG Time: 10:19  Rate: 87  Rhythm: normal sinus rhythm  Axis: Normal  Intervals:none  ST&T Change: None   PROCEDURES  Procedure(s) performed (including Critical Care):  Procedures   ____________________________________________   INITIAL IMPRESSION / ASSESSMENT AND PLAN / ED COURSE       34 year old male presents to the ED complaining of increasing shortness of breath following diagnosis of COVID-19 5 days ago.  He is not in any respiratory distress here in the ED and is maintaining O2 sats on room air.  EKG shows no evidence of ischemia or arrhythmia, troponin within normal limits and I doubt any cardiac involvement.  Lab work is only remarkable for mildly elevated creatinine, however patient does not appear dehydrated and this is likely his baseline.  Chest x-ray does seem to show focal opacity, concerning for superimposed bacterial pneumonia on top of COVID-19, especially given fever.  We will treat with doxycycline given his penicillin allergy, patient also given albuterol for symptomatic treatment.  He was counseled to follow-up with PCP and otherwise return to the ED for new or worsening symptoms, patient agrees with plan.      ____________________________________________   FINAL CLINICAL IMPRESSION(S) / ED DIAGNOSES  Final diagnoses:  Pneumonia due to COVID-19 virus  Community acquired pneumonia, unspecified laterality     ED Discharge Orders         Ordered    doxycycline (VIBRAMYCIN) 100 MG capsule  2 times daily     03/11/20 1341           Note:  This document was prepared using Dragon voice recognition software and may include  unintentional dictation errors.   Blake Divine, MD 03/11/20 [phone]

## 2020-06-01 IMAGING — CR DG CHEST 2V
1 series · 2 of 2 positions shown · non-contrast
Comparison: 02/17/2017

CLINICAL DATA: COVID 19 positive, shortness of breath

EXAM:
CHEST - 2 VIEW

[Series 1: w chest pa · 0.14mm/px · 2 of 2 slices shown]
[im 1/2]
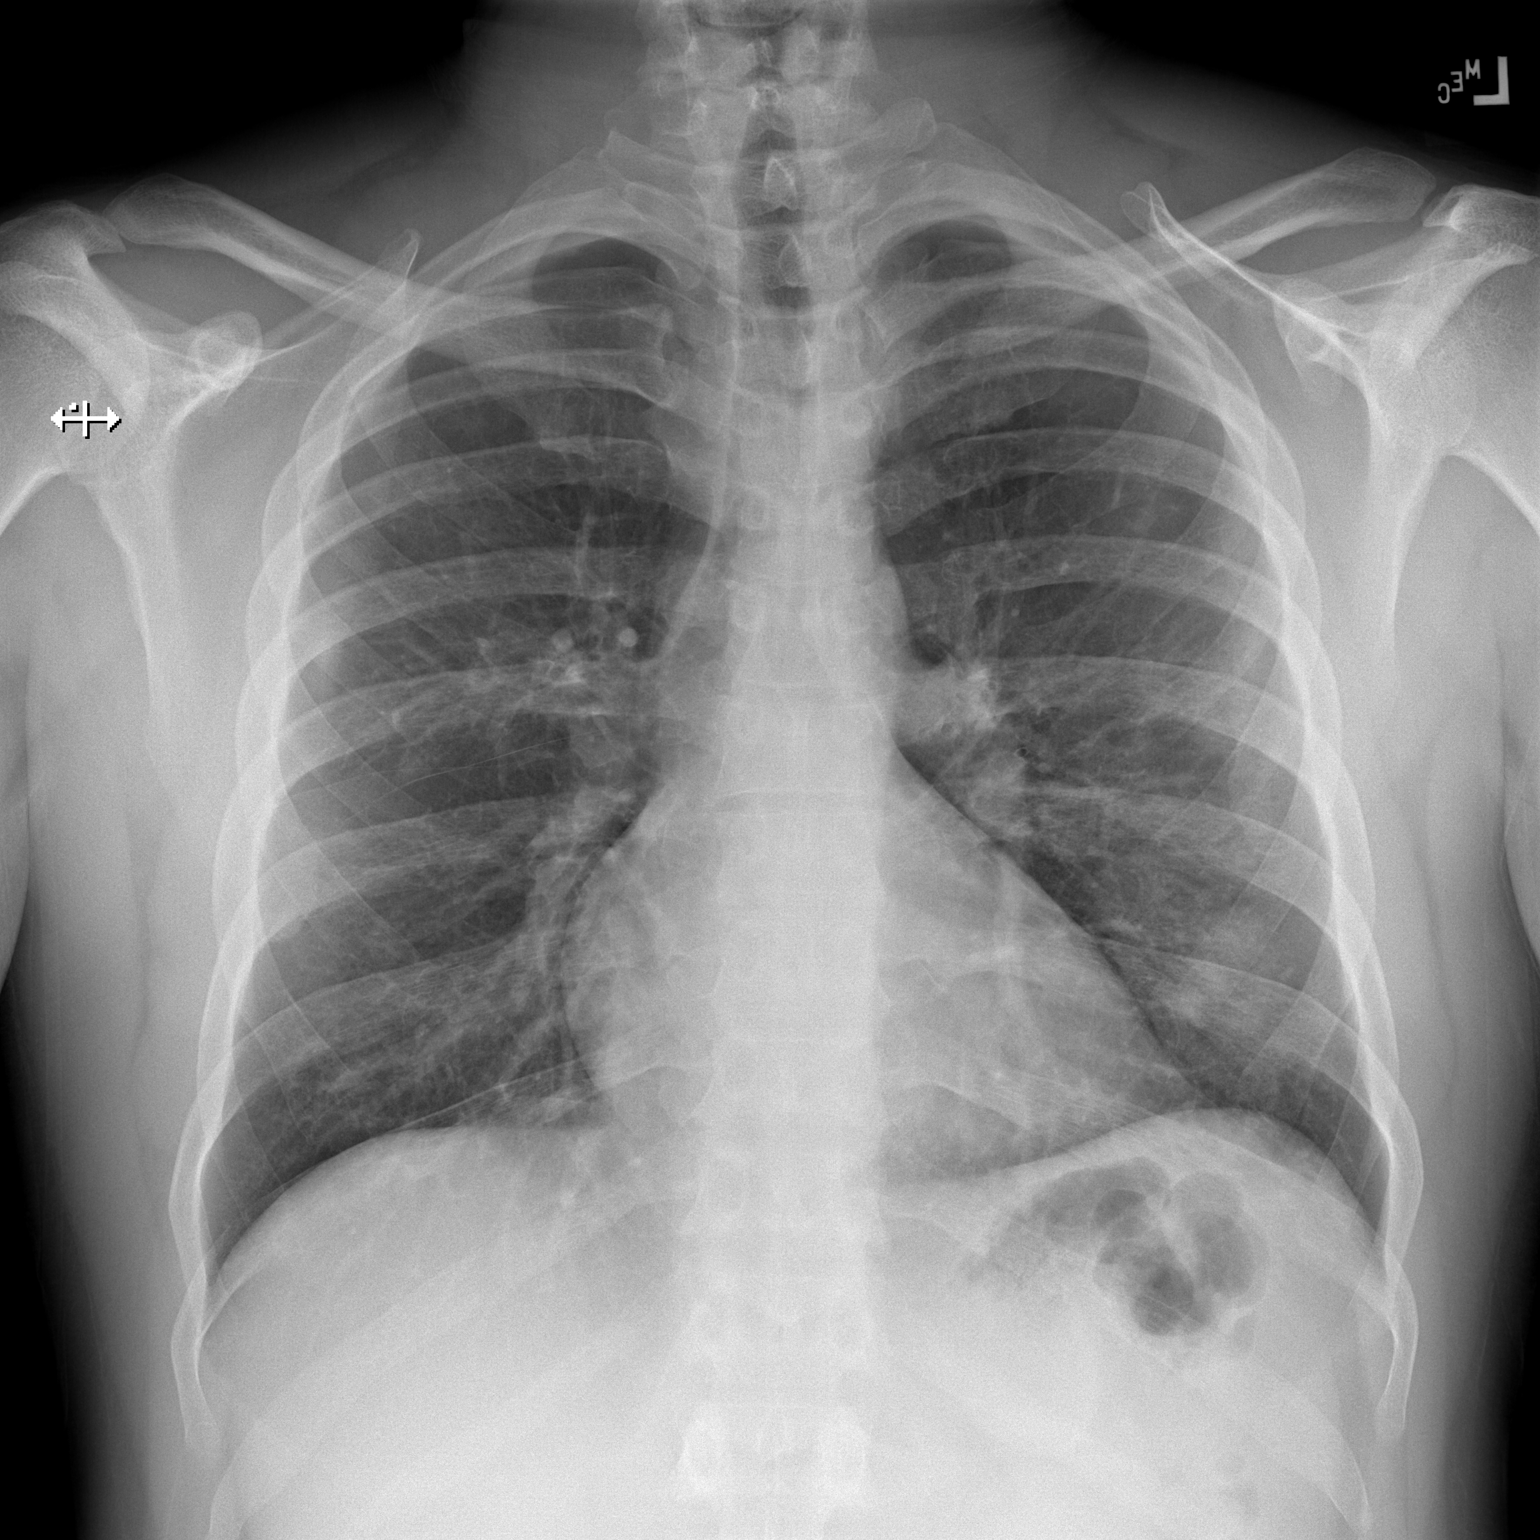
[im 2/2]
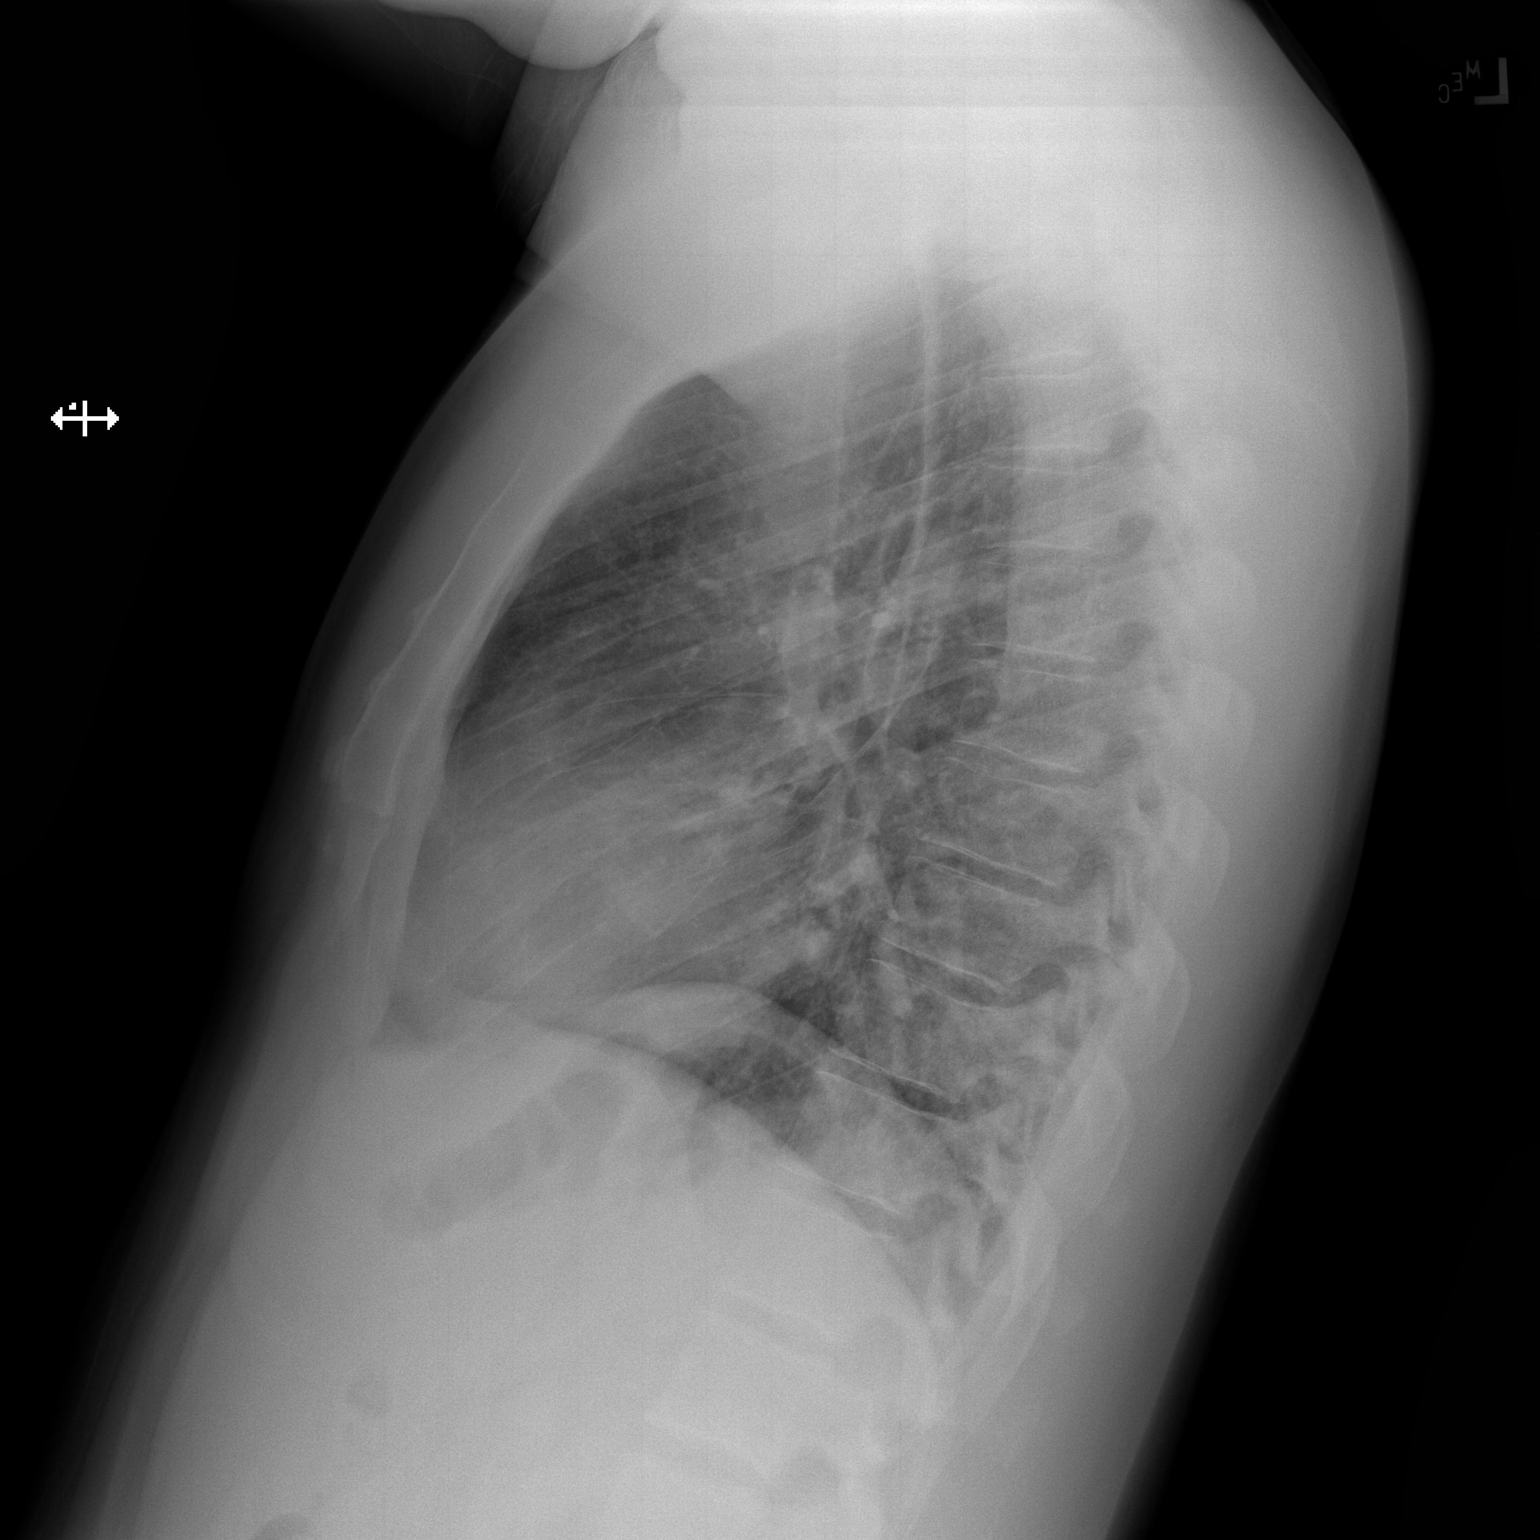

[2 of 2 positions shown; findings below may reference images not displayed]

FINDINGS: Patchy left lower lobe airspace disease which appears worse compared
with 02/17/2017 concerning for pneumonia. No pleural effusion or
pneumothorax. Stable cardiomediastinal silhouette. No aggressive
osseous lesion.
IMPRESSION: Patchy left lower lobe airspace disease which appears worse compared
with 02/17/2017 concerning for pneumonia.

## 2020-06-22 ENCOUNTER — Telehealth: Payer: Self-pay | Admitting: General Practice

## 2020-06-27 ENCOUNTER — Telehealth: Payer: Self-pay | Admitting: General Practice

## 2020-06-27 NOTE — Telephone Encounter (Signed)
Individual has been contacted 3+ times regarding ED referral. No further attempts to contact individual will be made. 

## 2020-12-07 ENCOUNTER — Other Ambulatory Visit: Payer: Self-pay

## 2020-12-07 ENCOUNTER — Emergency Department
Admission: EM | Admit: 2020-12-07 | Discharge: 2020-12-07 | Disposition: A | Discharge status: Home / Self Care | Payer: Self-pay | Attending: Emergency Medicine | Admitting: Emergency Medicine

## 2020-12-07 ENCOUNTER — Encounter: Payer: Self-pay | Admitting: Intensive Care

## 2020-12-07 DIAGNOSIS — Z20822 Contact with and (suspected) exposure to covid-19: Secondary | ICD-10-CM | POA: Insufficient documentation

## 2020-12-07 DIAGNOSIS — Z8616 Personal history of COVID-19: Secondary | ICD-10-CM | POA: Insufficient documentation

## 2020-12-07 DIAGNOSIS — J069 Acute upper respiratory infection, unspecified: Secondary | ICD-10-CM | POA: Insufficient documentation

## 2020-12-07 LAB — RESP PANEL BY RT-PCR (FLU A&B, COVID) ARPGX2
Influenza A by PCR: NEGATIVE
Influenza B by PCR: NEGATIVE
SARS Coronavirus 2 by RT PCR: NEGATIVE

## 2020-12-07 MED ORDER — AZITHROMYCIN 250 MG PO TABS: ORAL_TABLET | ORAL | 0 refills | Status: DC

## 2020-12-07 MED ORDER — BENZONATATE 100 MG PO CAPS: 100.0000 mg | ORAL_CAPSULE | Freq: Three times a day (TID) | ORAL | 0 refills | Status: AC | PRN

## 2020-12-07 NOTE — ED Triage Notes (Signed)
Patient c/o cough and congestion X1 week

## 2020-12-07 NOTE — Discharge Instructions (Signed)
Follow-up with your regular doctor as needed.  Return emergency department worsening.  Take medications as prescribed.  Also try over-the-counter Mucinex.

## 2020-12-07 NOTE — ED Provider Notes (Signed)
[facility] Emergency Department Provider Note  ____________________________________________   Event Date/Time   First MD Initiated Contact with Patient 12/07/20 1409     (approximate)  I have reviewed the triage vital signs and the nursing notes.   HISTORY  Chief Complaint Cough and Nasal Congestion    HPI Ray Mcdonald is a 34 y.o. male presents emergency department complaint of cough and congestion for 1 week.  States mucus is yellow and clear.  Had Covid back in March.  States he is vaccinated.  He denies chest pain or shortness of breath at this time.  No fever or chills.    Past Medical History:  Diagnosis Date  . COVID-19    03/06/20    There are no problems to display for this patient.   Past Surgical History:  Procedure Laterality Date  . HERNIA REPAIR      Prior to Admission medications   Medication Sig Start Date End Date Taking? Authorizing Provider  azithromycin (ZITHROMAX Z-PAK) 250 MG tablet 2 pills today then 1 pill a day for 4 days 12/07/20   Sherrie Mustache Roselyn Bering, PA-C  benzonatate (TESSALON PERLES) 100 MG capsule Take 1 capsule (100 mg total) by mouth 3 (three) times daily as needed for cough. 12/07/20 12/07/21  Alexandros Ewan, Roselyn Bering, PA-C  methocarbamol (ROBAXIN) 500 MG tablet Take 1 tablet (500 mg total) by mouth 3 (three) times daily. 05/25/19   Tommi Rumps, PA-C  naproxen (NAPROSYN) 500 MG tablet Take 1 tablet (500 mg total) by mouth 2 (two) times daily with a meal. 05/25/19   Tommi Rumps, PA-C    Allergies Hydrocodone and Penicillins  History reviewed. No pertinent family history.  Social History Social History   Tobacco Use  . Smoking status: Never Smoker  . Smokeless tobacco: Never Used  Substance Use Topics  . Alcohol use: Yes    Comment: occassionally  . Drug use: No    Review of Systems  Constitutional: No fever/chills Eyes: No visual changes. ENT: No sore throat. Respiratory: Positive  cough Cardiovascular: Denies chest pain Gastrointestinal: Denies abdominal pain Genitourinary: Negative for dysuria. Musculoskeletal: Negative for back pain. Skin: Negative for rash. Psychiatric: no mood changes,     ____________________________________________   PHYSICAL EXAM:  VITAL SIGNS: ED Triage Vitals [12/07/20 1255]  Enc Vitals Group     BP (!) 141/83     Pulse Rate 80     Resp 16     Temp 98.8 F (37.1 C)     Temp Source Oral     SpO2 100 %     Weight 240 lb (108.9 kg)     Height 6\' 3"  (1.905 m)     Head Circumference      Peak Flow      Pain Score 0     Pain Loc      Pain Edu?      Excl. in GC?     Constitutional: Alert and oriented. Well appearing and in no acute distress. Eyes: Conjunctivae are normal.  Head: Atraumatic. Nose: No congestion/rhinnorhea. Mouth/Throat: Mucous membranes are moist.   Neck:  supple no lymphadenopathy noted Cardiovascular: Normal rate, regular rhythm. Heart sounds are normal Respiratory: Normal respiratory effort.  No retractions, lungs c t a  GU: deferred Musculoskeletal: FROM all extremities, warm and well perfused Neurologic:  Normal speech and language.  Skin:  Skin is warm, dry and intact. No rash noted. Psychiatric: Mood and affect are normal. Speech and behavior  are normal.  ____________________________________________   LABS (all labs ordered are listed, but only abnormal results are displayed)  Labs Reviewed  RESP PANEL BY RT-PCR (FLU A&B, COVID) ARPGX2   ____________________________________________   ____________________________________________  RADIOLOGY    ____________________________________________   PROCEDURES  Procedure(s) performed: No  Procedures    ____________________________________________   INITIAL IMPRESSION / ASSESSMENT AND PLAN / ED COURSE  Pertinent labs & imaging results that were available during my care of the patient were reviewed by me and considered in my medical  decision making (see chart for details).   Patient is 34 year old male presents with URI symptoms.  See HPI.  Physical exam is unremarkable  Explained findings to the patient.  Covid test negative.  He will be given a prescription for Z-Pak and Tessalon Perles.  Take over-the-counter Mucinex.  Return emergency department worsening.  Given a work note discharged stable condition.     Ray Mcdonald was evaluated in Emergency Department on 12/07/2020 for the symptoms described in the history of present illness. He was evaluated in the context of the global COVID-19 pandemic, which necessitated consideration that the patient might be at risk for infection with the SARS-CoV-2 virus that causes COVID-19. Institutional protocols and algorithms that pertain to the evaluation of patients at risk for COVID-19 are in a state of rapid change based on information released by regulatory bodies including the CDC and federal and state organizations. These policies and algorithms were followed during the patient's care in the ED.    As part of my medical decision making, I reviewed the following data within the electronic MEDICAL RECORD NUMBER Nursing notes reviewed and incorporated, Labs reviewed , Old chart reviewed, Notes from prior ED visits and  Controlled Substance Database  ____________________________________________   FINAL CLINICAL IMPRESSION(S) / ED DIAGNOSES  Final diagnoses:  Acute URI      NEW MEDICATIONS STARTED DURING THIS VISIT:  New Prescriptions   AZITHROMYCIN (ZITHROMAX Z-PAK) 250 MG TABLET    2 pills today then 1 pill a day for 4 days   BENZONATATE (TESSALON PERLES) 100 MG CAPSULE    Take 1 capsule (100 mg total) by mouth 3 (three) times daily as needed for cough.     Note:  This document was prepared using Dragon voice recognition software and may include unintentional dictation errors.    Faythe Ghee, PA-C 12/07/20 1431    Shaune Pollack, MD 12/08/20 2251

## 2024-02-22 ENCOUNTER — Emergency Department
Admission: EM | Admit: 2024-02-22 | Discharge: 2024-02-22 | Disposition: A | Discharge status: Home / Self Care | Payer: Self-pay | Attending: Emergency Medicine | Admitting: Emergency Medicine

## 2024-02-22 ENCOUNTER — Other Ambulatory Visit: Payer: Self-pay

## 2024-02-22 DIAGNOSIS — R040 Epistaxis: Secondary | ICD-10-CM

## 2024-02-22 DIAGNOSIS — I10 Essential (primary) hypertension: Secondary | ICD-10-CM | POA: Insufficient documentation

## 2024-02-22 DIAGNOSIS — R030 Elevated blood-pressure reading, without diagnosis of hypertension: Secondary | ICD-10-CM | POA: Insufficient documentation

## 2024-02-22 DIAGNOSIS — J069 Acute upper respiratory infection, unspecified: Secondary | ICD-10-CM | POA: Insufficient documentation

## 2024-02-22 LAB — CBC
HCT: 48.6 % (ref 39.0–52.0)
Hemoglobin: 16.3 g/dL (ref 13.0–17.0)
MCH: 29 pg (ref 26.0–34.0)
MCHC: 33.5 g/dL (ref 30.0–36.0)
MCV: 86.5 fL (ref 80.0–100.0)
Platelets: 326 10*3/uL (ref 150–400)
RBC: 5.62 MIL/uL (ref 4.22–5.81)
RDW: 13.9 % (ref 11.5–15.5)
WBC: 5.5 10*3/uL (ref 4.0–10.5)
nRBC: 0 % (ref 0.0–0.2)

## 2024-02-22 LAB — BASIC METABOLIC PANEL
Anion gap: 8 (ref 5–15)
BUN: 15 mg/dL (ref 6–20)
CO2: 25 mmol/L (ref 22–32)
Calcium: 9.3 mg/dL (ref 8.9–10.3)
Chloride: 105 mmol/L (ref 98–111)
Creatinine, Ser: 1.42 mg/dL — ABNORMAL HIGH (ref 0.61–1.24)
GFR, Estimated: >60 mL/min (ref >60)
Glucose, Bld: 137 mg/dL — ABNORMAL HIGH (ref 70–99)
Potassium: 3.8 mmol/L (ref 3.5–5.1)
Sodium: 138 mmol/L (ref 135–145)

## 2024-02-22 LAB — RESP PANEL BY RT-PCR (RSV, FLU A&B, COVID)  RVPGX2
Influenza A by PCR: NEGATIVE
Influenza B by PCR: NEGATIVE
Resp Syncytial Virus by PCR: NEGATIVE
SARS Coronavirus 2 by RT PCR: NEGATIVE

## 2024-02-22 MED ORDER — OXYMETAZOLINE HCL 0.05 % NA SOLN
1.0000 | Freq: Once | NASAL | Status: AC
  Administered 2024-02-22: 1 via NASAL
  Filled 2024-02-22: qty 30

## 2024-02-22 MED ORDER — AMLODIPINE BESYLATE 5 MG PO TABS: 5.0000 mg | ORAL_TABLET | Freq: Every day | ORAL | 2 refills | Status: AC

## 2024-02-22 NOTE — ED Provider Notes (Signed)
 [facility] Provider Note    Event Date/Time   First MD Initiated Contact with Patient 02/22/24 [phone]     (approximate)   History   Cough   HPI  Ray Mcdonald is a 38 y.o. male no significant past medical history who presents to the emergency department with cough and nosebleed.  States that he has been having some subjective fever and chills.  Cough and congestion since Thursday.  Denies nausea, vomiting, shortness of breath or chest pain.  Started having a mild nosebleed on arrival.  Denies any falls or trauma.     Physical Exam   Triage Vital Signs: ED Triage Vitals  Encounter Vitals Group     BP 02/22/24 0844 (!) 165/98     Systolic BP Percentile --      Diastolic BP Percentile --      Pulse Rate 02/22/24 0844 76     Resp 02/22/24 0844 18     Temp 02/22/24 0844 98.3 F (36.8 C)     Temp src --      SpO2 02/22/24 0844 100 %     Weight 02/22/24 0845 270 lb (122.5 kg)     Height 02/22/24 0845 6\' 4"  (1.93 m)     Head Circumference --      Peak Flow --      Pain Score 02/22/24 0845 0     Pain Loc --      Pain Education --      Exclude from Growth Chart --     Most recent vital signs: Vitals:   02/22/24 0844  BP: (!) 165/98  Pulse: 76  Resp: 18  Temp: 98.3 F (36.8 C)  SpO2: 100%    Physical Exam Constitutional:      Appearance: He is well-developed.  HENT:     Head: Atraumatic.     Mouth/Throat:     Comments: Mild epistaxis to the left nare Eyes:     Conjunctiva/sclera: Conjunctivae normal.  Cardiovascular:     Rate and Rhythm: Regular rhythm.  Pulmonary:     Effort: No respiratory distress.  Musculoskeletal:     Cervical back: Normal range of motion.  Skin:    General: Skin is warm.  Neurological:     Mental Status: He is alert. Mental status is at baseline.      IMPRESSION / MDM / ASSESSMENT AND PLAN / ED COURSE  I reviewed the triage vital signs and the nursing notes.  Differential diagnosis including  epistaxis, viral illness including COVID/influenza, pneumonia  Lungs clear to auscultation with no focal findings have a low suspicion for community-acquired pneumonia.  COVID and influenza testing obtained and negative.  Labs (all labs ordered are listed, but only abnormal results are displayed) Labs interpreted as -    Labs Reviewed  BASIC METABOLIC PANEL - Abnormal; Notable for the following components:      Result Value   Glucose, Bld 137 (*)    Creatinine, Ser 1.42 (*)    All other components within normal limits  RESP PANEL BY RT-PCR (RSV, FLU A&B, COVID)  RVPGX2  CBC      Noted to have hypertension in the emergency department on chart review had multiple episodes in the past and has had hypertension since 2020.  Denies any significant salty diet.  Get a referral and we will start the patient on amlodipine.  Lab work obtained to check kidney function.  Normal GFR.  Will start on amlodipine and  discussed close follow-up with primary care physician and discussed return precautions for any ongoing or worsening symptoms.  Will give a 72-month supply.  Discussed instructions of how to treat epistaxis, likely in the setting of upper respiratory virus.  Reevaluation after pressure epistaxis has resolved.  Given Afrin in the emergency department.   PROCEDURES:  Critical Care performed: No  Procedures  Patient's presentation is most consistent with acute complicated illness / injury requiring diagnostic workup.   MEDICATIONS ORDERED IN ED: Medications  oxymetazoline (AFRIN) 0.05 % nasal spray 1 spray (1 spray Each Nare Given 02/22/24 0924)    FINAL CLINICAL IMPRESSION(S) / ED DIAGNOSES   Final diagnoses:  Uncontrolled hypertension  Upper respiratory tract infection, unspecified type  Epistaxis     Rx / DC Orders   ED Discharge Orders          Ordered    Ambulatory Referral to Primary Care (Establish Care)        02/22/24 0947    amLODipine (NORVASC) 5 MG tablet  Daily         02/22/24 1610             Note:  This document was prepared using Dragon voice recognition software and may include unintentional dictation errors.   Corena Herter, MD 02/22/24 1045

## 2024-02-22 NOTE — ED Triage Notes (Signed)
 Pt comes with cough congestion, fever and body aches. Pt states this started Thursday.

## 2024-02-22 NOTE — Discharge Instructions (Addendum)
 You are seen in the emergency department for upper respiratory virus.  He also had a nosebleed.  You were given Afrin in the emergency department and instructions of what to do if your nose started rebleeding at home.  Your blood pressure was also elevated in the emergency department and we started you on a blood pressure medication.  You were given a referral and information of how to follow-up with a primary care physician to establish care.  Return to the emergency department for any worsening symptoms.  Instructions for nose bleed: 1. Blow nose to get rid of any blood clots 2. Use 2 sprays of afrin (oxymetazoline) to the side that is bleeding 3. Apply direct pressure over the squishy part of your nose, set a timer, hold pressure for 15-20 mins.  4. Lean forward to avoid blood draining down your throat.   Thank you for choosing [place] for your health care, it was my pleasure to care for you today!  Corena Herter, MD

## 2024-02-22 NOTE — ED Notes (Signed)
 See triage note.Presents with cough and congestion  States sxs' started on Thursday  Also developed a nosebleed since arrival to room  Afebrile on arrival

## 2024-06-08 ENCOUNTER — Other Ambulatory Visit: Payer: Self-pay

## 2024-06-08 DIAGNOSIS — Z5321 Procedure and treatment not carried out due to patient leaving prior to being seen by health care provider: Secondary | ICD-10-CM | POA: Insufficient documentation

## 2024-06-08 DIAGNOSIS — X500XXA Overexertion from strenuous movement or load, initial encounter: Secondary | ICD-10-CM | POA: Insufficient documentation

## 2024-06-08 DIAGNOSIS — M545 Low back pain, unspecified: Secondary | ICD-10-CM | POA: Insufficient documentation

## 2024-06-08 NOTE — ED Triage Notes (Signed)
 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 Pt denies known injury, no relief with tylenol  at home.

## 2024-06-09 ENCOUNTER — Emergency Department
Admission: EM | Admit: 2024-06-09 | Discharge: 2024-06-09 | Discharge status: Against Medical Advice | Payer: Self-pay | Attending: Emergency Medicine | Admitting: Emergency Medicine
